# Patient Record
Sex: Female | Born: 1990 | Race: Black or African American | Hispanic: No | Marital: Single | Smoking: Never smoker
Health system: Southern US, Community
[De-identification: ages and names within clinical notes are randomized; demographics above are authoritative.]

## PROBLEM LIST (undated history)

## (undated) ENCOUNTER — Inpatient Hospital Stay (HOSPITAL_COMMUNITY): Payer: Self-pay

---

## 2011-08-01 ENCOUNTER — Other Ambulatory Visit: Payer: Self-pay | Admitting: Emergency Medicine

## 2011-08-01 ENCOUNTER — Ambulatory Visit (HOSPITAL_COMMUNITY)
Admission: RE | Admit: 2011-08-01 | Discharge: 2011-08-01 | Disposition: A | Discharge status: Home / Self Care | Payer: No Typology Code available for payment source | Source: Ambulatory Visit | Attending: Emergency Medicine | Admitting: Emergency Medicine

## 2011-08-01 DIAGNOSIS — S0990XA Unspecified injury of head, initial encounter: Secondary | ICD-10-CM | POA: Insufficient documentation

## 2011-08-01 DIAGNOSIS — T1490XA Injury, unspecified, initial encounter: Secondary | ICD-10-CM
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
## 2018-01-15 ENCOUNTER — Other Ambulatory Visit: Payer: Self-pay

## 2018-01-15 ENCOUNTER — Emergency Department (HOSPITAL_BASED_OUTPATIENT_CLINIC_OR_DEPARTMENT_OTHER)
Admission: EM | Admit: 2018-01-15 | Discharge: 2018-01-15 | Disposition: A | Discharge status: Home / Self Care | Payer: Medicaid Other | Attending: Emergency Medicine | Admitting: Emergency Medicine

## 2018-01-15 ENCOUNTER — Encounter (HOSPITAL_BASED_OUTPATIENT_CLINIC_OR_DEPARTMENT_OTHER): Payer: Self-pay

## 2018-01-15 DIAGNOSIS — R220 Localized swelling, mass and lump, head: Secondary | ICD-10-CM | POA: Insufficient documentation

## 2018-01-15 MED ORDER — PREDNISONE 50 MG PO TABS: ORAL_TABLET | ORAL | 0 refills | Status: DC

## 2018-01-15 MED ORDER — METHYLPREDNISOLONE SODIUM SUCC 125 MG IJ SOLR
125.0000 mg | Freq: Once | INTRAMUSCULAR | Status: AC
  Administered 2018-01-15: 125 mg via INTRAVENOUS
  Filled 2018-01-15: qty 2

## 2018-01-15 MED ORDER — DIPHENHYDRAMINE HCL 50 MG/ML IJ SOLN
25.0000 mg | Freq: Once | INTRAMUSCULAR | Status: AC
  Administered 2018-01-15: 25 mg via INTRAVENOUS
  Filled 2018-01-15: qty 1

## 2018-01-15 MED ORDER — FAMOTIDINE 20 MG PO TABS: 20.0000 mg | ORAL_TABLET | Freq: Every day | ORAL | 0 refills | Status: DC

## 2018-01-15 MED ORDER — VALACYCLOVIR HCL 1 G PO TABS: 1000.0000 mg | ORAL_TABLET | Freq: Three times a day (TID) | ORAL | 0 refills | Status: DC

## 2018-01-15 MED ORDER — FAMOTIDINE IN NACL 20-0.9 MG/50ML-% IV SOLN
20.0000 mg | Freq: Once | INTRAVENOUS | Status: AC
  Administered 2018-01-15: 20 mg via INTRAVENOUS
  Filled 2018-01-15: qty 50

## 2018-01-15 NOTE — Discharge Instructions (Addendum)
Take the steroids (prednisone) as prescribed, starting tomorrow.  Stop using your lip gloss.  Also take the antiviral medication (Valtrex; which you should start today) as there is some concern for possible viral infection.  Establish care with a primary doctor.  Return to the ED if you develop difficulty breathing, difficulty swallowing or any other concerns.

## 2018-01-15 NOTE — ED Provider Notes (Addendum)
.  I assumed care of this patient from Dr. Manus Gunningancour at 0700.  Please see their note for further details of Hx, PE.  Briefly patient is a 27 y.o. female who presented with lip swelling concerning for possible contact reaction to lip gloss vs HSV infection.   Current plan is to monitor for additional hour and reassess.  8:16 AM Pt reports mild improvement in swelling. No respiratory sxs.  The patient is safe for discharge with strict return precautions.   Disposition: Discharge  Condition: Good  I have discussed the results, Dx and Tx plan with the patient who expressed understanding and agree(s) with the plan. Discharge instructions discussed at great length. The patient was given strict return precautions who verbalized understanding of the instructions. No further questions at time of discharge.    ED Discharge Orders        Ordered    predniSONE (DELTASONE) 50 MG tablet     01/15/18 0659    valACYclovir (VALTREX) 1000 MG tablet  3 times daily     01/15/18 0659    famotidine (PEPCID) 20 MG tablet  Daily     01/15/18 0708       [facility] [address] [place] [id number] [phone] Schedule an appointment as soon as possible for a visit in 2 days      Cardama, Amadeo GarnetPedro Eduardo, MD 01/15/18 [phone]

## 2018-01-15 NOTE — ED Triage Notes (Signed)
Pt reports upper lip swelling since yesterday. Pt denies sob, dysphagia and dyspnea. Pt denies hx of oral swelling. Pt A+OX4.

## 2018-01-15 NOTE — ED Notes (Addendum)
Pt reports starting a new lip gloss x2 weeks ago, but has had no diet or soap change.

## 2018-01-15 NOTE — ED Provider Notes (Signed)
[facility] EMERGENCY DEPARTMENT Provider Note   CSN: [id number] Arrival date & time: 01/15/18  0546     History   Chief Complaint Chief Complaint  Patient presents with  . Oral Swelling    HPI Valerie Fleming is a 27 y.o. female.  Patient presents with left upper lip swelling that onset last night is progressively worsens.  Patient states she took some Benadryl last night without relief.  States her lip was itching yesterday and she scratched it but denies any other trauma.  Denies any new exposures to foods, medications, makeup, eating products or other hygiene products.  No difficulty breathing or difficulty swallowing.  No chest pain or shortness of breath.  This is not happened to her before.  She denies any bites or other exposures. No history of cold sores. No ACE inhibitor use. Has been using a new lip gloss for 3 weeks.   The history is provided by the patient.    History reviewed. No pertinent past medical history.  There are no active problems to display for this patient.   * The histories are not reviewed yet. Please review them in the "History" navigator section and refresh this SmartLink.   OB History   None      Home Medications    Prior to Admission medications   Not on File    Family History History reviewed. No pertinent family history.  Social History Social History   Tobacco Use  . Smoking status: Not on file  Substance Use Topics  . Alcohol use: Not on file  . Drug use: Not on file     Allergies   Patient has no known allergies.   Review of Systems Review of Systems  Constitutional: Negative for activity change, appetite change and fever.  HENT: Positive for facial swelling and mouth sores. Negative for congestion and trouble swallowing.   Respiratory: Negative for cough, chest tightness and shortness of breath.   Cardiovascular: Negative for chest pain and leg swelling.  Gastrointestinal: Negative for abdominal  pain, nausea and vomiting.  Genitourinary: Negative for dysuria, hematuria, vaginal bleeding and vaginal discharge.  Musculoskeletal: Negative for arthralgias and myalgias.  Skin: Negative for rash.  Neurological: Negative for dizziness, weakness and headaches.   all other systems are negative except as noted in the HPI and PMH.     Physical Exam Updated Vital Signs BP 121/73 (BP Location: Right Arm)   Pulse (!) 58   Temp 98.3 F (36.8 C) (Oral)   Resp 18   Ht 5\' 3"  (1.6 m)   Wt 63.5 kg (140 lb)   LMP 12/20/2017   SpO2 100%   BMI 24.80 kg/m   Physical Exam  Constitutional: She is oriented to person, place, and time. She appears well-developed and well-nourished. No distress.  HENT:  Head: Normocephalic and atraumatic.  Mouth/Throat: Oropharynx is clear and moist. No oropharyngeal exudate.  Isolated swelling to upper lip left greater than right. There is some crusting lesions on upper lip as well possibly vesicular  Lower lip is normal. No tongue swelling.  No uvula swelling, posterior pharynx is normal.  Normal voice no stridor.  Eyes: Pupils are equal, round, and reactive to light. Conjunctivae and EOM are normal.  Neck: Normal range of motion. Neck supple.  No meningismus.  Cardiovascular: Normal rate, regular rhythm, normal heart sounds and intact distal pulses.  No murmur heard. Pulmonary/Chest: Effort normal and breath sounds normal. No respiratory distress.  Abdominal: Soft. There is no tenderness.  There is no rebound and no guarding.  Musculoskeletal: Normal range of motion. She exhibits no edema or tenderness.  Neurological: She is alert and oriented to person, place, and time. No cranial nerve deficit. She exhibits normal muscle tone. Coordination normal.  No ataxia on finger to nose bilaterally. No pronator drift. 5/5 strength throughout. CN 2-12 intact.Equal grip strength. Sensation intact.   Skin: Skin is warm.  Psychiatric: She has a normal mood and affect.  Her behavior is normal.  Nursing note and vitals reviewed.      ED Treatments / Results  Labs (all labs ordered are listed, but only abnormal results are displayed) Labs Reviewed - No data to display  EKG None  Radiology No results found.  Procedures Procedures (including critical care time)  Medications Ordered in ED Medications  famotidine (PEPCID) IVPB 20 mg premix (20 mg Intravenous New Bag/Given 01/15/18 [id number])  methylPREDNISolone sodium succinate (SOLU-MEDROL) 125 mg/2 mL injection 125 mg (125 mg Intravenous Given 01/15/18 0619)  diphenhydrAMINE (BENADRYL) injection 25 mg (25 mg Intravenous Given 01/15/18 [id number])     Initial Impression / Assessment and Plan / ED Course  I have reviewed the triage vital signs and the nursing notes.  Pertinent labs & imaging results that were available during my care of the patient were reviewed by me and considered in my medical decision making (see chart for details).    Upper lip swelling since last night.  There is no tongue swelling or posterior pharynx swelling.  Patient denies any new allergens.  She does not take medications.  Doubt angioedema. Suspect localized reaction likely due to lip gloss?  Patient noticing some improvement after steroids and antihistamines.  Advised to discontinue lip gloss. Given her crusty vesicular lesions on her lip will also treat with Valtrex. Plan observation till 8 AM and then discharge if no further progression of her lip swelling. Dr. Eudelia Bunchardama to assume care at shift change.  Final Clinical Impressions(s) / ED Diagnoses   Final diagnoses:  Swelling of upper lip    ED Discharge Orders    None       Adamariz Gillott, Jeannett SeniorStephen, MD 01/15/18 [phone]

## 2018-06-23 ENCOUNTER — Encounter (HOSPITAL_BASED_OUTPATIENT_CLINIC_OR_DEPARTMENT_OTHER): Payer: Self-pay

## 2018-06-23 ENCOUNTER — Emergency Department (HOSPITAL_BASED_OUTPATIENT_CLINIC_OR_DEPARTMENT_OTHER)
Admission: EM | Admit: 2018-06-23 | Discharge: 2018-06-23 | Disposition: A | Discharge status: Home / Self Care | Payer: Medicaid Other | Attending: Emergency Medicine | Admitting: Emergency Medicine

## 2018-06-23 ENCOUNTER — Other Ambulatory Visit: Payer: Self-pay

## 2018-06-23 DIAGNOSIS — H1032 Unspecified acute conjunctivitis, left eye: Secondary | ICD-10-CM | POA: Insufficient documentation

## 2018-06-23 MED ORDER — FLUORESCEIN SODIUM 1 MG OP STRP
ORAL_STRIP | OPHTHALMIC | Status: AC
  Filled 2018-06-23: qty 1

## 2018-06-23 MED ORDER — FLUORESCEIN SODIUM 1 MG OP STRP
1.0000 | ORAL_STRIP | Freq: Once | OPHTHALMIC | Status: AC
  Administered 2018-06-23: 1 via OPHTHALMIC

## 2018-06-23 MED ORDER — TETRACAINE HCL 0.5 % OP SOLN
1.0000 [drp] | Freq: Once | OPHTHALMIC | Status: AC
  Administered 2018-06-23: 2 [drp] via OPHTHALMIC

## 2018-06-23 MED ORDER — TETRACAINE HCL 0.5 % OP SOLN
OPHTHALMIC | Status: AC
  Filled 2018-06-23: qty 4

## 2018-06-23 MED ORDER — CIPROFLOXACIN HCL 0.3 % OP SOLN: 2.0000 [drp] | OPHTHALMIC | 0 refills | Status: DC

## 2018-06-23 NOTE — ED Provider Notes (Signed)
[facility] EMERGENCY DEPARTMENT Provider Note   CSN: [id number] Arrival date & time: 06/23/18  0014     History   Chief Complaint Chief Complaint  Patient presents with  . Facial Swelling    HPI Breleigh Carpino is a 27 y.o. female.  The history is provided by the patient.  Eye Problem   This is a new problem. The current episode started 2 days ago. The problem occurs constantly. The problem has not changed since onset.There is a problem in the left eye. There was no injury mechanism. The patient is experiencing no pain. There is no history of trauma to the eye. There is no known exposure to pink eye. She does not wear contacts. Associated symptoms include eye redness. Pertinent negatives include no blurred vision, no decreased vision and no photophobia. She has tried nothing for the symptoms. The treatment provided no relief.  Swelling of the eye and eyelid.    History reviewed. No pertinent past medical history.  There are no active problems to display for this patient.   History reviewed. No pertinent surgical history.   OB History   None      Home Medications    Prior to Admission medications   Medication Sig Start Date End Date Taking? Authorizing Provider  famotidine (PEPCID) 20 MG tablet Take 1 tablet (20 mg total) by mouth daily. 01/15/18   Rancour, Jeannett Senior, MD  valACYclovir (VALTREX) 1000 MG tablet Take 1 tablet (1,000 mg total) by mouth 3 (three) times daily. 01/15/18   Glynn Octave, MD    Family History No family history on file.  Social History Social History   Tobacco Use  . Smoking status: Not on file  Substance Use Topics  . Alcohol use: Not on file  . Drug use: Not on file     Allergies   Patient has no known allergies.   Review of Systems Review of Systems  HENT: Negative for drooling and hearing loss.   Eyes: Positive for redness and itching. Negative for blurred vision, photophobia and visual disturbance.  Respiratory:  Negative for shortness of breath.   Cardiovascular: Negative for chest pain.  All other systems reviewed and are negative.    Physical Exam Updated Vital Signs BP 122/66 (BP Location: Left Arm)   Pulse (!) 58   Temp 98.6 F (37 C) (Oral)   Resp 18   Ht 5\' 3"  (1.6 m)   Wt 59 kg   LMP 05/27/2018   SpO2 100%   BMI 23.03 kg/m   Physical Exam  Constitutional: She is oriented to person, place, and time. She appears well-developed and well-nourished. No distress.  HENT:  Head: Normocephalic and atraumatic.  Mouth/Throat: No oropharyngeal exudate.  Eyes: Pupils are equal, round, and reactive to light. EOM are normal. Left eye exhibits discharge. Left conjunctiva is injected. Left eye exhibits normal extraocular motion and no nystagmus.    Neck: Normal range of motion. Neck supple.  Cardiovascular: Normal rate, regular rhythm, normal heart sounds and intact distal pulses.  Pulmonary/Chest: Effort normal and breath sounds normal. No respiratory distress.  Abdominal: Soft. Bowel sounds are normal. She exhibits no mass. There is no tenderness. There is no guarding.  Musculoskeletal: Normal range of motion.  Neurological: She is alert and oriented to person, place, and time.  Skin: Skin is warm and dry. Capillary refill takes less than 2 seconds.  Psychiatric: She has a normal mood and affect.     ED Treatments / Results  Labs (  all labs ordered are listed, but only abnormal results are displayed) Labs Reviewed - No data to display  EKG None  Radiology No results found.  Procedures Procedures (including critical care time)  Medications Ordered in ED Medications  fluorescein 1 MG ophthalmic strip (has no administration in time range)  tetracaine (PONTOCAINE) 0.5 % ophthalmic solution (has no administration in time range)  fluorescein ophthalmic strip 1 strip (1 strip Left Eye Given 06/23/18 0028)  tetracaine (PONTOCAINE) 0.5 % ophthalmic solution 1-2 drop (2 drops Left Eye  Given 06/23/18 0028)       Final Clinical Impressions(s) / ED Diagnoses     Will start eye drops.  Lid swelling likely allergic start allergy medication.    Return for weakness, numbness, changes in vision or speech, fevers >100.4 unrelieved by medication, shortness of breath, intractable vomiting, or diarrhea, abdominal pain, Inability to tolerate liquids or food, cough, altered mental status or any concerns. No signs of systemic illness or infection. The patient is nontoxic-appearing on exam and vital signs are within normal limits.    I have reviewed the triage vital signs and the nursing notes. Pertinent labs &imaging results that were available during my care of the patient were reviewed by me and considered in my medical decision making (see chart for details).  After history, exam, and medical workup I feel the patient has been appropriately medically screened and is safe for discharge home. Pertinent diagnoses were discussed with the patient. Patient was given return precautions.      Doloris Servantes, MD 06/23/18 [phone]

## 2018-06-23 NOTE — ED Triage Notes (Signed)
Pt c/o swelling and itching to left eye and left cheek that started this afternoon

## 2018-08-17 DIAGNOSIS — Z1388 Encounter for screening for disorder due to exposure to contaminants: Secondary | ICD-10-CM | POA: Diagnosis not present

## 2018-08-17 DIAGNOSIS — Z3009 Encounter for other general counseling and advice on contraception: Secondary | ICD-10-CM | POA: Diagnosis not present

## 2018-08-17 DIAGNOSIS — Z0389 Encounter for observation for other suspected diseases and conditions ruled out: Secondary | ICD-10-CM | POA: Diagnosis not present

## 2018-11-07 ENCOUNTER — Ambulatory Visit: Payer: Medicaid Other | Admitting: Obstetrics

## 2018-11-07 ENCOUNTER — Encounter: Payer: Self-pay | Admitting: Obstetrics

## 2018-11-07 VITALS — BP 122/76 | HR 62 | Wt 146.6 lb

## 2018-11-07 MED ORDER — FLUCONAZOLE 200 MG PO TABS: 200.0000 mg | ORAL_TABLET | ORAL | 2 refills | Status: DC

## 2018-11-07 NOTE — Progress Notes (Signed)
Pt presents for vaginal irritation. Pt needs pap smear; however, she started her period today and it's heavy.

## 2018-11-07 NOTE — Progress Notes (Signed)
Patient rescheduled because she started period today.  Brock BadHARLES A. Mitchelle Sultan MD 11-07-2018

## 2018-11-18 ENCOUNTER — Ambulatory Visit (INDEPENDENT_AMBULATORY_CARE_PROVIDER_SITE_OTHER): Payer: Medicaid Other | Admitting: Obstetrics

## 2018-11-18 ENCOUNTER — Encounter: Payer: Self-pay | Admitting: Obstetrics

## 2018-11-18 ENCOUNTER — Other Ambulatory Visit (HOSPITAL_COMMUNITY)
Admission: RE | Admit: 2018-11-18 | Discharge: 2018-11-18 | Disposition: A | Discharge status: Home / Self Care | Payer: Medicaid Other | Source: Ambulatory Visit | Attending: Obstetrics | Admitting: Obstetrics

## 2018-11-18 VITALS — BP 111/72 | HR 58 | Resp 16 | Ht 63.0 in | Wt 139.4 lb

## 2018-11-18 DIAGNOSIS — Z01419 Encounter for gynecological examination (general) (routine) without abnormal findings: Secondary | ICD-10-CM | POA: Insufficient documentation

## 2018-11-18 DIAGNOSIS — Z30015 Encounter for initial prescription of vaginal ring hormonal contraceptive: Secondary | ICD-10-CM

## 2018-11-18 DIAGNOSIS — N898 Other specified noninflammatory disorders of vagina: Secondary | ICD-10-CM

## 2018-11-18 DIAGNOSIS — Z113 Encounter for screening for infections with a predominantly sexual mode of transmission: Secondary | ICD-10-CM | POA: Diagnosis not present

## 2018-11-18 DIAGNOSIS — Z0000 Encounter for general adult medical examination without abnormal findings: Secondary | ICD-10-CM

## 2018-11-18 DIAGNOSIS — B3731 Acute candidiasis of vulva and vagina: Secondary | ICD-10-CM

## 2018-11-18 DIAGNOSIS — Z3009 Encounter for other general counseling and advice on contraception: Secondary | ICD-10-CM

## 2018-11-18 DIAGNOSIS — B9689 Other specified bacterial agents as the cause of diseases classified elsewhere: Secondary | ICD-10-CM

## 2018-11-18 DIAGNOSIS — N760 Acute vaginitis: Secondary | ICD-10-CM

## 2018-11-18 DIAGNOSIS — B373 Candidiasis of vulva and vagina: Secondary | ICD-10-CM

## 2018-11-18 MED ORDER — FLUCONAZOLE 200 MG PO TABS: 200.0000 mg | ORAL_TABLET | ORAL | 5 refills | Status: DC

## 2018-11-18 MED ORDER — ETONOGESTREL-ETHINYL ESTRADIOL 0.12-0.015 MG/24HR VA RING: VAGINAL_RING | VAGINAL | 12 refills | Status: DC

## 2018-11-18 MED ORDER — METRONIDAZOLE 500 MG PO TABS: 500.0000 mg | ORAL_TABLET | Freq: Two times a day (BID) | ORAL | 5 refills | Status: DC

## 2018-11-18 NOTE — Patient Instructions (Signed)
Antibiotic Medicine, Adult ° °Antibiotic medicines are used to treat infections caused by bacteria, such as strep throat and urinary tract infection (UTI). Antibiotic medicines will not work for viral illnesses, such as colds or the flu (influenza). They work by killing the bacteria that is making you sick. Antibiotics can also have serious side effects. It is important that you take antibiotic medicines safely and only when needed. °When do I need to take antibiotics? °Antibiotics are medicines that treat bacterial infections. You may need antibiotics for: °· UTI. °· Strep throat. °· Meningitis. This infection affects the spinal cord and brain. °· Bacterial sinusitis. °· Serious lung infection. °You may start antibiotics while your health care provider waits for test results to come back. Common tests may include throat, urine, blood, or mucus culture. Your health care provider may change or stop the antibiotic depending on your test results. °When are antibiotics not needed? °You do not need antibiotics for most common illnesses. These illnesses may be caused by a virus, not a bacteria. You do not need antibiotics for: °· The common cold. °· Influenza. °· Sore throat. °· Discolored mucus. °· Bronchitis. °Antibiotics are not always needed for all bacterial infections. Many of these infections clear up without antibiotic treatment. Do not ask for or take antibiotics when they are not necessary. °How long should I take the antibiotic? °You must take the entire prescription. Continue to take your antibiotic for as long as told by your health care provider. Do not stop taking it even if you start to feel better. If you stop taking it too soon: °· You may start to feel sick again. °· Your infection may become harder to treat. °· Complications may develop. °Each course of antibiotics needs a different amount of time to work. Some antibiotic courses last only a few days. Some last about a week to 10 days. In some cases,  you may need to take antibiotics for a few weeks to completely treat the infection. °What if I miss a dose? °Try not to miss any doses of medicine. If you miss a dose, call your health care provider or pharmacist for advice. Sometimes it is okay to take the missed dose as soon as possible. °What are the risks of taking antibiotics? °Most antibiotics can cause an infection called Clostridioides difficile (C. difficile or C. diff), which causes severe diarrhea. This infection happens when the antibiotics kill the healthy bacteria in your intestines. This allows C. diff to grow. The infection needs to be treated right away. Let your health care provider know if: °· You have diarrhea while taking an antibiotic. °· You have diarrhea after you stop taking an antibiotic. C. diff infection can start weeks after stopping the antibiotic. °Taking an antibiotic also puts you at risk for getting a bacteria that does not respond to medicine (antibiotic-resistant infection) in the future. Antibiotics can cause bacteria to change so that if the antibiotic is taken again, the medicine is not able to kill the bacteria. These infections can be more serious and, in some cases, life-threatening. °Do antibiotics affect birth control? °Birth control pills may not work while you are on antibiotics. If you are taking birth control pills, continue taking them as usual and use a second form of birth control, such as a condom, to avoid unwanted pregnancy. Continue using the second form of birth control until your health care provider says you can stop. °What else should I know about taking antibiotics? °It is important for you to   take antibiotics exactly as told. Make sure that you: °· Take the entire course of antibiotic that was prescribed. Do not stop taking your antibiotics even if your symptoms improve. °· Take the correct amount of medicine each day. °· Ask your health care provider: °? How long to wait in between doses. °? If the  antibiotic should be taken with food. °? If there are any foods, drinks, or medicines that you should avoid while taking the antibiotics. °? If there are any side effects you should be aware of. °· Only use the antibiotics prescribed for you by your health care provider. Do not use antibiotics prescribed for someone else. °· Drink a large glass of water along with the antibiotics. °· Ask the pharmacist for a syringe, cup, or spoon that properly measures the antibiotics. °· Throw away any leftover medicine. °Contact a health care provider if: °· Your symptoms get worse. °· You have new joint pain or muscle aches that begin after starting the antibiotic. °When should I seek immediate medical care? °· You have signs of a serious allergic reaction to antibiotics. If you have signs of a severe allergic reaction, stop taking the antibiotic right away. Signs may include: °? Hives, which are raised, itchy, red bumps on the skin. °? Skin rash. °? Trouble breathing. °? A wheezing sound when you breathe. °? Swelling anywhere on your body. °? Feeling dizzy. °? Vomiting. °· Your urine turns dark or becomes blood-colored. °· Your skin turns yellow. °· You bruise or bleed easily. °· You have severe diarrhea and abdominal cramps. °· You have a severe headache. °Summary °· Antibiotic medicines are used to treat infections caused by bacteria, such as strep throat and UTIs. It is important that you take antibiotic medicines only when needed. °· Your health care provider may change or stop the antibiotic depending on your test results. °· Most antibiotics can cause an infection called Clostridioides difficile (C. difficile or C. diff), which causes severe diarrhea. Let your health care provider know if you develop diarrhea while taking an antibiotic. °· Take the entire course of antibiotic that was prescribed. °This information is not intended to replace advice given to you by your health care provider. Make sure you discuss any  questions you have with your health care provider. °Document Released: 05/27/2004 Document Revised: 03/15/2018 Document Reviewed: 09/15/2016 °[employer] Interactive Patient Education © 2019 [employer] ° °
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
## 2018-11-18 NOTE — Progress Notes (Signed)
Patient is here c/o for annual; med refill on nuvaring; vaginal discharge - little odor, watery for the ast two weeks. Seen at [facility] about a month ago Dx BV/CV Rx Flagyl and Diflucan.

## 2018-11-19 LAB — RPR: RPR Ser Ql: NONREACTIVE

## 2018-11-19 LAB — HEPATITIS B SURFACE ANTIGEN: Hepatitis B Surface Ag: NEGATIVE

## 2018-11-19 LAB — HIV ANTIBODY (ROUTINE TESTING W REFLEX): HIV SCREEN 4TH GENERATION: NONREACTIVE

## 2018-11-19 LAB — HEPATITIS C ANTIBODY

## 2018-11-21 LAB — CERVICOVAGINAL ANCILLARY ONLY
BACTERIAL VAGINITIS: NEGATIVE
Candida vaginitis: NEGATIVE
Chlamydia: POSITIVE — AB
NEISSERIA GONORRHEA: NEGATIVE
TRICH (WINDOWPATH): NEGATIVE

## 2018-11-22 ENCOUNTER — Other Ambulatory Visit: Payer: Self-pay | Admitting: Obstetrics

## 2018-11-22 DIAGNOSIS — A749 Chlamydial infection, unspecified: Secondary | ICD-10-CM

## 2018-11-22 LAB — CYTOLOGY - PAP: Diagnosis: NEGATIVE

## 2018-11-22 MED ORDER — AZITHROMYCIN 500 MG PO TABS: 1000.0000 mg | ORAL_TABLET | Freq: Once | ORAL | 0 refills | Status: AC

## 2018-11-22 MED ORDER — CEFIXIME 400 MG PO CAPS: 400.0000 mg | ORAL_CAPSULE | Freq: Once | ORAL | 0 refills | Status: AC

## 2020-07-31 DIAGNOSIS — Z113 Encounter for screening for infections with a predominantly sexual mode of transmission: Secondary | ICD-10-CM | POA: Diagnosis not present

## 2020-12-10 DIAGNOSIS — Z113 Encounter for screening for infections with a predominantly sexual mode of transmission: Secondary | ICD-10-CM | POA: Diagnosis not present

## 2022-02-18 DIAGNOSIS — Z114 Encounter for screening for human immunodeficiency virus [HIV]: Secondary | ICD-10-CM | POA: Diagnosis not present

## 2022-02-18 DIAGNOSIS — Z3044 Encounter for surveillance of vaginal ring hormonal contraceptive device: Secondary | ICD-10-CM | POA: Diagnosis not present

## 2022-02-18 DIAGNOSIS — Z113 Encounter for screening for infections with a predominantly sexual mode of transmission: Secondary | ICD-10-CM | POA: Diagnosis not present

## 2022-02-18 DIAGNOSIS — B3731 Acute candidiasis of vulva and vagina: Secondary | ICD-10-CM | POA: Diagnosis not present

## 2022-09-10 DIAGNOSIS — Z3202 Encounter for pregnancy test, result negative: Secondary | ICD-10-CM | POA: Diagnosis not present

## 2022-09-10 DIAGNOSIS — Z114 Encounter for screening for human immunodeficiency virus [HIV]: Secondary | ICD-10-CM | POA: Diagnosis not present

## 2022-09-10 DIAGNOSIS — Z113 Encounter for screening for infections with a predominantly sexual mode of transmission: Secondary | ICD-10-CM | POA: Diagnosis not present

## 2022-09-10 DIAGNOSIS — N898 Other specified noninflammatory disorders of vagina: Secondary | ICD-10-CM | POA: Diagnosis not present

## 2023-01-15 ENCOUNTER — Other Ambulatory Visit: Payer: Self-pay

## 2023-01-15 ENCOUNTER — Emergency Department (HOSPITAL_BASED_OUTPATIENT_CLINIC_OR_DEPARTMENT_OTHER)
Admission: EM | Admit: 2023-01-15 | Discharge: 2023-01-15 | Disposition: A | Discharge status: Home / Self Care | Payer: No Typology Code available for payment source | Attending: Emergency Medicine | Admitting: Emergency Medicine

## 2023-01-15 ENCOUNTER — Encounter (HOSPITAL_BASED_OUTPATIENT_CLINIC_OR_DEPARTMENT_OTHER): Payer: Self-pay

## 2023-01-15 DIAGNOSIS — R519 Headache, unspecified: Secondary | ICD-10-CM | POA: Diagnosis not present

## 2023-01-15 DIAGNOSIS — Y92410 Unspecified street and highway as the place of occurrence of the external cause: Secondary | ICD-10-CM | POA: Diagnosis not present

## 2023-01-15 MED ORDER — IBUPROFEN 600 MG PO TABS: 600.0000 mg | ORAL_TABLET | Freq: Four times a day (QID) | ORAL | 0 refills | Status: DC | PRN

## 2023-01-15 MED ORDER — CYCLOBENZAPRINE HCL 10 MG PO TABS: 10.0000 mg | ORAL_TABLET | Freq: Two times a day (BID) | ORAL | 0 refills | Status: AC | PRN

## 2023-01-15 MED ORDER — ACETAMINOPHEN 325 MG PO TABS: 650.0000 mg | ORAL_TABLET | Freq: Four times a day (QID) | ORAL | 0 refills | Status: AC | PRN
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
## 2023-01-15 NOTE — Discharge Instructions (Addendum)
Your headache may be related to a muscle strain in the back of your scalp.  I recommended ibuprofen and Tylenol as needed for pain, and muscle relaxers may help you at night with your shoulder pain and your headache.  We discussed the option of a CT scan to make sure that there is no bleeding inside the skull or more serious injury.  We discussed the risks and benefits of obtaining a CT scan now, versus a watchful waiting approach at home.  You did not want the CT scan at this time.  I advised that you should return to the ER if you have worsening headache, blurred vision, strokelike symptoms, or any other emergency medical concerns.
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
## 2023-02-11 DIAGNOSIS — Z113 Encounter for screening for infections with a predominantly sexual mode of transmission: Secondary | ICD-10-CM | POA: Diagnosis not present

## 2023-02-11 DIAGNOSIS — B9689 Other specified bacterial agents as the cause of diseases classified elsewhere: Secondary | ICD-10-CM | POA: Diagnosis not present

## 2023-02-11 DIAGNOSIS — Z114 Encounter for screening for human immunodeficiency virus [HIV]: Secondary | ICD-10-CM | POA: Diagnosis not present

## 2023-02-11 DIAGNOSIS — N760 Acute vaginitis: Secondary | ICD-10-CM | POA: Diagnosis not present

## 2023-02-11 DIAGNOSIS — B3731 Acute candidiasis of vulva and vagina: Secondary | ICD-10-CM | POA: Diagnosis not present

## 2023-05-25 DIAGNOSIS — Z3202 Encounter for pregnancy test, result negative: Secondary | ICD-10-CM | POA: Diagnosis not present

## 2023-05-25 DIAGNOSIS — Z113 Encounter for screening for infections with a predominantly sexual mode of transmission: Secondary | ICD-10-CM | POA: Diagnosis not present

## 2023-05-25 DIAGNOSIS — Z114 Encounter for screening for human immunodeficiency virus [HIV]: Secondary | ICD-10-CM | POA: Diagnosis not present

## 2023-05-25 DIAGNOSIS — Z3044 Encounter for surveillance of vaginal ring hormonal contraceptive device: Secondary | ICD-10-CM | POA: Diagnosis not present

## 2023-08-27 DIAGNOSIS — Z114 Encounter for screening for human immunodeficiency virus [HIV]: Secondary | ICD-10-CM | POA: Diagnosis not present

## 2023-08-27 DIAGNOSIS — Z113 Encounter for screening for infections with a predominantly sexual mode of transmission: Secondary | ICD-10-CM | POA: Diagnosis not present

## 2024-01-10 DIAGNOSIS — N898 Other specified noninflammatory disorders of vagina: Secondary | ICD-10-CM | POA: Diagnosis not present

## 2024-01-10 DIAGNOSIS — Z113 Encounter for screening for infections with a predominantly sexual mode of transmission: Secondary | ICD-10-CM | POA: Diagnosis not present

## 2024-01-10 DIAGNOSIS — Z3202 Encounter for pregnancy test, result negative: Secondary | ICD-10-CM | POA: Diagnosis not present

## 2024-02-18 ENCOUNTER — Encounter (HOSPITAL_BASED_OUTPATIENT_CLINIC_OR_DEPARTMENT_OTHER): Payer: Self-pay

## 2024-02-18 ENCOUNTER — Emergency Department (HOSPITAL_BASED_OUTPATIENT_CLINIC_OR_DEPARTMENT_OTHER)
Admission: EM | Admit: 2024-02-18 | Discharge: 2024-02-18 | Disposition: A | Discharge status: Home / Self Care | Attending: Emergency Medicine | Admitting: Emergency Medicine

## 2024-02-18 ENCOUNTER — Emergency Department (HOSPITAL_BASED_OUTPATIENT_CLINIC_OR_DEPARTMENT_OTHER)

## 2024-02-18 ENCOUNTER — Other Ambulatory Visit: Payer: Self-pay

## 2024-02-18 DIAGNOSIS — O26891 Other specified pregnancy related conditions, first trimester: Secondary | ICD-10-CM

## 2024-02-18 DIAGNOSIS — Z3A00 Weeks of gestation of pregnancy not specified: Secondary | ICD-10-CM | POA: Diagnosis not present

## 2024-02-18 DIAGNOSIS — Z3A01 Less than 8 weeks gestation of pregnancy: Secondary | ICD-10-CM | POA: Diagnosis not present

## 2024-02-18 DIAGNOSIS — R1030 Lower abdominal pain, unspecified: Secondary | ICD-10-CM | POA: Diagnosis not present

## 2024-02-18 DIAGNOSIS — O26899 Other specified pregnancy related conditions, unspecified trimester: Secondary | ICD-10-CM | POA: Diagnosis present

## 2024-02-18 LAB — URINALYSIS, ROUTINE W REFLEX MICROSCOPIC
Bilirubin Urine: NEGATIVE
Glucose, UA: NEGATIVE mg/dL
Hgb urine dipstick: NEGATIVE
Ketones, ur: NEGATIVE mg/dL
Nitrite: NEGATIVE
Protein, ur: NEGATIVE mg/dL
Specific Gravity, Urine: 1.02 (ref 1.005–1.030)
pH: 5.5 (ref 5.0–8.0)

## 2024-02-18 LAB — URINALYSIS, MICROSCOPIC (REFLEX)

## 2024-02-18 LAB — PREGNANCY, URINE: Preg Test, Ur: POSITIVE — AB

## 2024-02-18 LAB — HCG, QUANTITATIVE, PREGNANCY: hCG, Beta Chain, Quant, S: 49 m[IU]/mL — ABNORMAL HIGH (ref <5)

## 2024-02-18 NOTE — ED Provider Notes (Signed)
 [place] [facility] Provider Note   CSN: [id number] Arrival date & time: 02/18/24  1106     History  Chief Complaint  Patient presents with   Abdominal Cramping    Valerie Fleming is a 33 y.o. female.  33 year old female presenting with abdominal cramping.  Patient took 2 pregnancy tests last night which were both positive, she was concerned because she was experiencing 9/10 lower abdominal cramping.  Her LMP was sometime in April, she is unsure of the specific date.  She denies nausea/vomiting/diarrhea, chest pain, shortness of breath, lower extremity swelling, dysuria/hematuria.  She plans to follow-up with an OB/GYN given recent positive pregnancy test.   Abdominal Cramping       Home Medications Prior to Admission medications   Medication Sig Start Date End Date Taking? Authorizing Provider  acetaminophen  (TYLENOL ) 325 MG tablet Take 2 tablets (650 mg total) by mouth every 6 (six) hours as needed for up to 30 doses for moderate pain. 01/15/23   Arvilla Birmingham, MD  cyclobenzaprine  (FLEXERIL ) 10 MG tablet Take 1 tablet (10 mg total) by mouth 2 (two) times daily as needed for up to 10 doses for muscle spasms. 01/15/23   Arvilla Birmingham, MD  etonogestrel -ethinyl estradiol  (NUVARING) 0.12-0.015 MG/24HR vaginal ring Place 1 applicator vaginally every 30 (thirty) days.    [provider]  etonogestrel -ethinyl estradiol  (NUVARING) 0.12-0.015 MG/24HR vaginal ring Insert vaginally and leave in place for 3 consecutive weeks, then remove for 1 week. 11/18/18   Gabrielle Joiner, MD  fluconazole  (DIFLUCAN ) 200 MG tablet Take 1 tablet (200 mg total) by mouth every 3 (three) days. 11/18/18   Gabrielle Joiner, MD  ibuprofen  (ADVIL ) 600 MG tablet Take 1 tablet (600 mg total) by mouth every 6 (six) hours as needed for up to 30 doses. 01/15/23   Arvilla Birmingham, MD  metroNIDAZOLE  (FLAGYL ) 500 MG tablet Take 1 tablet (500 mg total) by mouth 2 (two)  times daily. 11/18/18   Gabrielle Joiner, MD      Allergies    Patient has no known allergies.    Review of Systems   Review of Systems  Physical Exam Updated Vital Signs BP 119/64 (BP Location: Right Arm)   Pulse (!) 56   Temp 98.7 F (37.1 C) (Oral)   Resp 18   SpO2 100%  Physical Exam Vitals and nursing note reviewed.  HENT:     Head: Normocephalic.  Eyes:     Extraocular Movements: Extraocular movements intact.  Cardiovascular:     Rate and Rhythm: Normal rate and regular rhythm.     Heart sounds: Normal heart sounds.  Pulmonary:     Effort: Pulmonary effort is normal.     Breath sounds: Normal breath sounds.  Abdominal:     Palpations: Abdomen is soft.     Tenderness: There is abdominal tenderness (generalized lower abdominal tenderness, mild). There is no guarding or rebound.  Neurological:     Mental Status: She is alert.     ED Results / Procedures / Treatments   Labs (all labs ordered are listed, but only abnormal results are displayed) Labs Reviewed  PREGNANCY, URINE - Abnormal; Notable for the following components:      Result Value   Preg Test, Ur POSITIVE (*)    All other components within normal limits  URINALYSIS, ROUTINE W REFLEX MICROSCOPIC - Abnormal; Notable for the following components:   Leukocytes,Ua SMALL (*)    All other  components within normal limits  URINALYSIS, MICROSCOPIC (REFLEX) - Abnormal; Notable for the following components:   Bacteria, UA RARE (*)    All other components within normal limits  HCG, QUANTITATIVE, PREGNANCY    EKG None  Radiology US  OB Transvaginal Result Date: 02/18/2024 CLINICAL DATA:  Low pelvic pain for 2 days, pregnant EXAM: OBSTETRIC <14 WK US  AND TRANSVAGINAL OB US  TECHNIQUE: Both transabdominal and transvaginal ultrasound examinations were performed for complete evaluation of the gestation as well as the maternal uterus, adnexal regions, and pelvic cul-de-sac. Transvaginal technique was performed to  assess early pregnancy. COMPARISON:  None Available. FINDINGS: Intrauterine gestational sac: None Yolk sac:  Not Visualized. Embryo:  Not Visualized. Cardiac Activity: Not Visualized. Heart Rate: Not visualized. Subchorionic hemorrhage:  None visualized. Maternal uterus/adnexae: Complex echogenic lesion of the right ovary measuring 1.7 x 1.0 cm with some peripheral Doppler flow. IMPRESSION: 1. No candidate intrauterine gestation at clinically estimated gestational age of [redacted] weeks, 5 days. Early pregnancy of unknown location. Recommend ectopic precautions, serial beta hCG, and follow-up ultrasound in 7-14 days to assess for continued development and viability. 2. Complex echogenic lesion of the right ovary measuring 1.7 cm, most likely a corpus luteum in this setting. This is not cystic in nature ultrasound and not characteristic in appearance for an ovarian ectopic pregnancy, which are exceedingly rare. Electronically Signed   By: Fredricka Jenny M.D.   On: 02/18/2024 13:57   US  OB Comp < 14 Wks Result Date: 02/18/2024 CLINICAL DATA:  Low pelvic pain for 2 days, pregnant EXAM: OBSTETRIC <14 WK US  AND TRANSVAGINAL OB US  TECHNIQUE: Both transabdominal and transvaginal ultrasound examinations were performed for complete evaluation of the gestation as well as the maternal uterus, adnexal regions, and pelvic cul-de-sac. Transvaginal technique was performed to assess early pregnancy. COMPARISON:  None Available. FINDINGS: Intrauterine gestational sac: None Yolk sac:  Not Visualized. Embryo:  Not Visualized. Cardiac Activity: Not Visualized. Heart Rate: Not visualized. Subchorionic hemorrhage:  None visualized. Maternal uterus/adnexae: Complex echogenic lesion of the right ovary measuring 1.7 x 1.0 cm with some peripheral Doppler flow. IMPRESSION: 1. No candidate intrauterine gestation at clinically estimated gestational age of [redacted] weeks, 5 days. Early pregnancy of unknown location. Recommend ectopic precautions, serial beta  hCG, and follow-up ultrasound in 7-14 days to assess for continued development and viability. 2. Complex echogenic lesion of the right ovary measuring 1.7 cm, most likely a corpus luteum in this setting. This is not cystic in nature ultrasound and not characteristic in appearance for an ovarian ectopic pregnancy, which are exceedingly rare. Electronically Signed   By: Fredricka Jenny M.D.   On: 02/18/2024 13:57    Procedures Procedures    Medications Ordered in ED Medications - No data to display  ED Course/ Medical Decision Making/ A&P                                 Medical Decision Making This patient presents to the ED for concern of abdominal cramping, this involves an extensive number of treatment options, and is a complaint that carries with it a high risk of complications and morbidity.  The differential diagnosis includes uterine cramping, inevitable/missed/spontaneous abortion, ectopic pregnancy.   Co morbidities that complicate the patient evaluation  Pregnancy   Lab Tests:  I Ordered, and personally interpreted labs.  The pertinent results include: Urine hCG positive.  Urinalysis notable for small leukocytes, rare bacteria noted on microscopy.  This is unlikely to be representative of a true urinary tract infection given that patient is not having dysuria, however in accordance with a cog guidelines I will obtain a urine culture and treat patient if >100,000 CFU's.  Quantitative hCG is 49, this is consistent with a gestational age of less than 1 week or may be trending down, I do not have a baseline quantitative hCG for comparison.   Imaging Studies ordered:  I ordered imaging studies including TVUS  I independently visualized and interpreted imaging which showed  1. No candidate intrauterine gestation at clinically estimated gestational age of [redacted] weeks, 5 days. Early pregnancy of unknown location. Recommend ectopic precautions, serial beta hCG, and follow-up ultrasound in  7-14 days to assess for continued development and viability.  2. Complex echogenic lesion of the right ovary measuring 1.7 cm, most likely a corpus luteum in this setting. This is not cystic in nature ultrasound and not characteristic in appearance for an ovarian ectopic pregnancy, which are exceedingly rare.  I agree with the radiologist interpretation     Problem List / ED Course / Critical interventions / Medication management I have reviewed the patients home medicines and have made adjustments as needed    Test / Admission - Considered:  Physical exam notable for tenderness palpation in the lower abdomen.  Vital signs are reassuring, patient is afebrile, no evidence of hypotension, does have borderline bradycardia however this may be representative of her baseline.  See above for lab interpretations and imaging results.  Patient is likely early on in her pregnancy, or quantitative hCG may be representative of a downward trend, however there is no baseline for comparison.  Patient has not been evaluated by OB/GYN for this pregnancy, will advise that she follow-up with [facility]U.  Discussed ectopic precautions, given that at this stage and early pregnancy we cannot rule out ectopic pregnancy definitively, advised her to follow-up in 48 hours with the MAU at [facility] in order to trend her quantitative hCG as well as plan for repeat transvaginal ultrasound in 7 to 14 days in order to assess for possible pregnancy.  Strict return precautions discussed, patient voiced understanding was agreeable with this plan.    Amount and/or Complexity of Data Reviewed Labs: ordered. Radiology: ordered.           Final Clinical Impression(s) / ED Diagnoses Final diagnoses:  Abdominal pain during pregnancy in first trimester    Rx / DC Orders ED Discharge Orders     None         Adolm Ahumada 02/18/24 1417    Nicklas Barns, MD 02/19/24 [phone]

## 2024-02-18 NOTE — Discharge Instructions (Addendum)
 Go to [facility] hospital on [address] to the Maternity Admissions Unit (MAU) for any pregnancy related concerns, follow-up with them in 48hrs for repeat quantitative hcg with plan for repeated transvaginal ultrasound in 7-14 days.  Return to the emergency department if your symptoms worsen.

## 2024-02-18 NOTE — ED Notes (Signed)
 Pt alert and oriented X 4 at the time of discharge. RR even and unlabored. No acute distress noted. Pt verbalized understanding of discharge instructions as discussed. Pt ambulatory to lobby at time of discharge.

## 2024-02-18 NOTE — ED Triage Notes (Signed)
 Pt reports lower abdominal cramping that started yesterday. Took 2 preg test and was +. Denies vaginal bleeding or other symptoms  LMP: "sometime in April"   G:3, P:1

## 2024-02-19 LAB — URINE CULTURE: Culture: NO GROWTH

## 2024-04-12 DIAGNOSIS — Z113 Encounter for screening for infections with a predominantly sexual mode of transmission: Secondary | ICD-10-CM | POA: Diagnosis not present

## 2024-04-12 DIAGNOSIS — Z114 Encounter for screening for human immunodeficiency virus [HIV]: Secondary | ICD-10-CM | POA: Diagnosis not present

## 2024-04-12 DIAGNOSIS — Z30015 Encounter for initial prescription of vaginal ring hormonal contraceptive: Secondary | ICD-10-CM | POA: Diagnosis not present

## 2024-09-29 ENCOUNTER — Inpatient Hospital Stay (HOSPITAL_COMMUNITY)

## 2024-09-29 ENCOUNTER — Inpatient Hospital Stay (HOSPITAL_COMMUNITY)
Admission: AD | Admit: 2024-09-29 | Discharge: 2024-09-29 | Disposition: A | Discharge status: Home / Self Care | Attending: Obstetrics and Gynecology | Admitting: Obstetrics and Gynecology

## 2024-09-29 ENCOUNTER — Other Ambulatory Visit: Payer: Self-pay

## 2024-09-29 ENCOUNTER — Encounter: Payer: Self-pay | Admitting: Nurse Practitioner

## 2024-09-29 DIAGNOSIS — O3680X0 Pregnancy with inconclusive fetal viability, not applicable or unspecified: Secondary | ICD-10-CM

## 2024-09-29 DIAGNOSIS — R7989 Other specified abnormal findings of blood chemistry: Secondary | ICD-10-CM | POA: Diagnosis not present

## 2024-09-29 DIAGNOSIS — O209 Hemorrhage in early pregnancy, unspecified: Secondary | ICD-10-CM | POA: Diagnosis not present

## 2024-09-29 DIAGNOSIS — Z3A00 Weeks of gestation of pregnancy not specified: Secondary | ICD-10-CM | POA: Diagnosis not present

## 2024-09-29 DIAGNOSIS — O26891 Other specified pregnancy related conditions, first trimester: Secondary | ICD-10-CM | POA: Insufficient documentation

## 2024-09-29 DIAGNOSIS — N939 Abnormal uterine and vaginal bleeding, unspecified: Secondary | ICD-10-CM

## 2024-09-29 DIAGNOSIS — Z640 Problems related to unwanted pregnancy: Secondary | ICD-10-CM | POA: Diagnosis not present

## 2024-09-29 DIAGNOSIS — O200 Threatened abortion: Secondary | ICD-10-CM | POA: Diagnosis not present

## 2024-09-29 DIAGNOSIS — O99011 Anemia complicating pregnancy, first trimester: Secondary | ICD-10-CM | POA: Insufficient documentation

## 2024-09-29 DIAGNOSIS — D509 Iron deficiency anemia, unspecified: Secondary | ICD-10-CM | POA: Diagnosis not present

## 2024-09-29 LAB — URINALYSIS, ROUTINE W REFLEX MICROSCOPIC
Bilirubin Urine: NEGATIVE
Glucose, UA: NEGATIVE mg/dL
Ketones, ur: NEGATIVE mg/dL
Leukocytes,Ua: NEGATIVE
Nitrite: NEGATIVE
Protein, ur: NEGATIVE mg/dL
Specific Gravity, Urine: 1.008 (ref 1.005–1.030)
pH: 6 (ref 5.0–8.0)

## 2024-09-29 LAB — HCG, QUANTITATIVE, PREGNANCY: hCG, Beta Chain, Quant, S: 81 m[IU]/mL — ABNORMAL HIGH

## 2024-09-29 LAB — CBC
HCT: 28.8 % — ABNORMAL LOW (ref 36.0–46.0)
Hemoglobin: 8.3 g/dL — ABNORMAL LOW (ref 12.0–15.0)
MCH: 19.6 pg — ABNORMAL LOW (ref 26.0–34.0)
MCHC: 28.8 g/dL — ABNORMAL LOW (ref 30.0–36.0)
MCV: 68.1 fL — ABNORMAL LOW (ref 80.0–100.0)
Platelets: 438 K/uL — ABNORMAL HIGH (ref 150–400)
RBC: 4.23 MIL/uL (ref 3.87–5.11)
RDW: 19.9 % — ABNORMAL HIGH (ref 11.5–15.5)
WBC: 7.1 K/uL (ref 4.0–10.5)
nRBC: 0 % (ref 0.0–0.2)

## 2024-09-29 LAB — ABO/RH: ABO/RH(D): O POS

## 2024-09-29 LAB — POCT PREGNANCY, URINE: Preg Test, Ur: POSITIVE — AB

## 2024-09-29 LAB — WET PREP, GENITAL
Clue Cells Wet Prep HPF POC: NONE SEEN
Sperm: NONE SEEN
Trich, Wet Prep: NONE SEEN
WBC, Wet Prep HPF POC: <10
Yeast Wet Prep HPF POC: NONE SEEN

## 2024-09-29 MED ORDER — FERRIC MALTOL 30 MG PO CAPS: 1.0000 | ORAL_CAPSULE | Freq: Two times a day (BID) | ORAL | 2 refills | Status: AC

## 2024-09-29 NOTE — Telephone Encounter (Signed)
 My chart message sent regarding Hgb 8.3 c/w anemia and RX sent to pharmacy. Patient also made aware that CBC and iron panel may be repeated on Sunday 10/02/23 with iron panel and stat HCG level. --------------------------------------------  Olam Dalton, MSN, [facility], [facility]

## 2024-09-29 NOTE — MAU Provider Note (Signed)
 CC/ vaginal bleeding   S/HPI [name] is a 34 y.o. G1P0 patient who presents to MAU today with complaint of reporting a positive HPT on 09/19/2024 and had an [facility].  She states that she is not desiring this pregnancy and plans to terminate.  She reports vaginal bleeding started last night and was told that she would need to have her pregnancy evaluated prior to proceeding with the termination.  She is unsure of her LMP at this time.   O BP (!) 120/57 (BP Location: Left Arm)   Pulse (!) 57   Temp 98 F (36.7 C) (Oral)   Resp 16   Ht 5' 3 (1.6 m)   Wt 63.5 kg   LMP 01/23/2024 (Within Months)   SpO2 100%   BMI 24.80 kg/m  Physical Exam Vitals and nursing note reviewed.  Constitutional:      General: She is not in acute distress.    Appearance: Normal appearance. She is not ill-appearing.  HENT:     Head: Normocephalic.     Nose: Nose normal.     Mouth/Throat:     Mouth: Mucous membranes are moist.  Cardiovascular:     Rate and Rhythm: Normal rate.  Pulmonary:     Effort: Pulmonary effort is normal.  Abdominal:     General: There is no distension.     Palpations: Abdomen is soft.     Tenderness: There is no abdominal tenderness.  Musculoskeletal:        General: Normal range of motion.     Cervical back: Normal range of motion.  Skin:    General: Skin is warm.  Neurological:     Mental Status: She is alert and oriented to person, place, and time.  Psychiatric:        Mood and Affect: Mood normal.        Behavior: Behavior normal.    MDM  HIGH  Vaginal bleeding in early pregnacy  CBC:  Hgb 8.3/ HCT 28.8 ( Anemia -Patient asymptomatic therefore RX for Iron supplement sent to pharmacy of choice) Patient also encouraged to f/u with Primary Physician regarding low H/H and Anemia  HCG Quant: 81 ( Plan for repeat HCG in 48 hours) Sunday 10/01/24 ABO: O Positive OB Ultrasound (pregnancy of unknown anatomical location at  this time versus early gestation with beta-hCG level at 81-will plan to trend hCG levels) Vaginal Swabs ( Wet prep negative) - GC pending at discharge UA: Large blood otherwise negative for UTI   Differential diagnosis considered for 1st trimester vaginal bleeding includes but is not limited to: ectopic pregnancy, complete spontaneous abortion, incomplete abortion, missed abortion, threatened abortion, embryonic/fetal demise, cervical insufficiency, cervical or vaginal disorder    Orders Placed This Encounter  Procedures   Wet prep, genital    Standing Status:   Standing    Number of Occurrences:   1   US  OB LESS THAN 14 WEEKS WITH OB TRANSVAGINAL    Standing Status:   Standing    Number of Occurrences:   1    Symptom/Reason for Exam:   Vaginal bleeding in pregnancy, first trimester [[id number]]   Urinalysis, Routine w reflex microscopic -Urine, Clean Catch    Standing Status:   Standing    Number of Occurrences:   1    Specimen Source:   Urine, Clean Catch [76]   CBC    Standing Status:   Standing    Number of Occurrences:  1   hCG, quantitative, pregnancy    Standing Status:   Standing    Number of Occurrences:   1   Diet NPO time specified    Standing Status:   Standing    Number of Occurrences:   1   Pregnancy, urine POC    Standing Status:   Standing    Number of Occurrences:   1   ABO/Rh    Standing Status:   Standing    Number of Occurrences:   1   Discharge patient Discharge disposition: 01-Home or Self Care; Discharge patient date: 09/29/2024    Standing Status:   Standing    Number of Occurrences:   1    Discharge disposition:   01-Home or Self Care [1]    Discharge patient date:   09/29/2024      Results for orders placed or performed during the hospital encounter of 09/29/24 (from the past 24 hours)  Wet prep, genital     Status: None   Collection Time: 09/29/24 11:30 AM   Specimen: Urine, Clean Catch  Result Value Ref Range   Yeast Wet Prep HPF POC NONE SEEN NONE  SEEN   Trich, Wet Prep NONE SEEN NONE SEEN   Clue Cells Wet Prep HPF POC NONE SEEN NONE SEEN   WBC, Wet Prep HPF POC <10 <10   Sperm NONE SEEN   Pregnancy, urine POC     Status: Abnormal   Collection Time: 09/29/24 11:32 AM  Result Value Ref Range   Preg Test, Ur POSITIVE (A) NEGATIVE  Urinalysis, Routine w reflex microscopic -Urine, Clean Catch     Status: Abnormal   Collection Time: 09/29/24 11:42 AM  Result Value Ref Range   Color, Urine STRAW (A) YELLOW   APPearance CLEAR CLEAR   Specific Gravity, Urine 1.008 1.005 - 1.030   pH 6.0 5.0 - 8.0   Glucose, UA NEGATIVE NEGATIVE mg/dL   Hgb urine dipstick LARGE (A) NEGATIVE   Bilirubin Urine NEGATIVE NEGATIVE   Ketones, ur NEGATIVE NEGATIVE mg/dL   Protein, ur NEGATIVE NEGATIVE mg/dL   Nitrite NEGATIVE NEGATIVE   Leukocytes,Ua NEGATIVE NEGATIVE   RBC / HPF 0-5 0 - 5 RBC/hpf   WBC, UA 0-5 0 - 5 WBC/hpf   Bacteria, UA RARE (A) NONE SEEN   Squamous Epithelial / HPF 0-5 0 - 5 /HPF   Mucus PRESENT   CBC     Status: Abnormal   Collection Time: 09/29/24 11:48 AM  Result Value Ref Range   WBC 7.1 4.0 - 10.5 K/uL   RBC 4.23 3.87 - 5.11 MIL/uL   Hemoglobin 8.3 (L) 12.0 - 15.0 g/dL   HCT 71.1 (L) 63.9 - 53.9 %   MCV 68.1 (L) 80.0 - 100.0 fL   MCH 19.6 (L) 26.0 - 34.0 pg   MCHC 28.8 (L) 30.0 - 36.0 g/dL   RDW 80.0 (H) 88.4 - 84.4 %   Platelets 438 (H) 150 - 400 K/uL   nRBC 0.0 0.0 - 0.2 %  ABO/Rh     Status: None   Collection Time: 09/29/24 11:48 AM  Result Value Ref Range   ABO/RH(D) O POS    No rh immune globuloin      NOT A RH IMMUNE GLOBULIN CANDIDATE, PT RH POSITIVE Performed at [facility], [address] [place], [place] [address]   hCG, quantitative, pregnancy     Status: Abnormal   Collection Time: 09/29/24 11:48 AM  Result Value Ref Range  hCG, Beta Chain, Quant, S 81 (H) <5 mIU/mL     Narrative & Impression  EXAM: ULTRASOUND FIRST TRIMESTER   TECHNIQUE: Transabdominal and Transvaginal first trimester  obstetric pelvic duplex ultrasound was performed with real-time imaging, color flow Doppler imaging, and spectral analysis.   COMPARISON: None available.   CLINICAL HISTORY: Vaginal bleeding in pregnancy, first trimester.   FINDINGS:   UTERUS: No focal myometrial mass.   GESTATIONAL SAC(S): There is no intrauterine gestational sac, yolk sac, or embryo. No subchorionic hemorrhage.   YOLK SAC: No yolk sac.   EMBRYO(<11WK) /FETUS(>=11WK): No embryo.   CROWN RUMP LENGTH: Not measured   RATE OF CARDIAC ACTIVITY: Not measured.   RIGHT OVARY: Unremarkable. Normal arterial and venous flow.   LEFT OVARY: Unremarkable. Normal arterial and venous flow.   FREE FLUID: No free fluid.   MEASUREMENTS   ESTIMATED GESTATIONAL AGE BY CURRENT ULTRASOUND: Not applicable as no intrauterine gestational sac, yolk sac, or embryo is identified.   ESTIMATED GESTATIONAL AGE BY LMP/PRIOR ULTRASOUND: Not provided.   ESTIMATED DUE DATE: Not applicable.   IMPRESSION: 1. Pregnancy of unknown location in the setting of first-trimester vaginal bleeding, with no intrauterine gestational sac identified. 2. Differential diagnosis includes very early intrauterine pregnancy not yet visible, occult ectopic pregnancy, or completed spontaneous abortion. 3. Recommend serial quantitative beta-hCG and repeat transvaginal ultrasound in 7 to 10 days, sooner with worsening pain or bleeding.   Electronically signed by: Waddell Calk MD 09/29/2024 02:02 PM EST RP Workstation: [id number]      I have reviewed the patient chart and performed the physical exam . I have ordered & interpreted the lab results and reviewed and interpreted the OB ultrasound images independently and agree with the radiologist findings.    ASSESSMENT Medical screening exam complete  Elevated serum hCG Serum hCG 81 Repeat in 48 hours  Pregnancy of unknown anatomic location Will trend hCG levels Follow-up ultrasound as  clinically appropriate or in 7 to 10 days for viability  Vaginal bleeding Precautions regarding heavy vaginal bleeding and when to return to the MAU or seek care urgently  Miscarriage, threatened, early pregnancy - Patient seeking eTOP - Undesired pregnancy   Iron deficiency anemia, unspecified iron deficiency anemia type Prescription sent for Accufer ->to patient's pharmacy Asymptomatic for s/s of anemia at this time but in the setting of VB would encourage iron supplementation and an Iron panel at [facility] or with PCP   Iron rich foods encouraged.  Patient also encouraged to follow-up with primary care physician regarding anemia   PLAN  Return in 48 hours for f/u HCG  Ectopic precautions discussed with patient and she verbalized understanding  Discharge from MAU in stable condition  See AVS for full description of educational information and instructions provided to the patient at time of discharge  Warning signs for worsening condition that would warrant emergency follow-up discussed  Patient may return to MAU as needed   Littie Olam LABOR, NP 09/29/2024 2:45 PM

## 2024-09-29 NOTE — Discharge Instructions (Addendum)
 Return on Sunday 10/01/24 for HCG labs STAT  Please start Iron supplementation

## 2024-09-29 NOTE — MAU Note (Signed)
 Valerie Fleming is a 34 y.o. at Unknown here in MAU reporting: +HPT on 09/19/24 and [facility]. States she is not planning on keeping this pregnancy. Vaginal bleeding started last night and [facility] needed to have the pregnancy evaluated before they could proceed with termination.   LMP: unsure  Onset of complaint: last night  Pain score: 4 Vitals:   09/29/24 1135  BP: (!) 120/57  Pulse: (!) 57  Resp: 16  Temp: 98 F (36.7 C)  SpO2: 100%     FHT:n/a  Lab orders placed from triage:  cuba workup

## 2024-10-02 LAB — GC/CHLAMYDIA PROBE AMP (~~LOC~~) NOT AT ARMC
Chlamydia: NEGATIVE
Comment: NEGATIVE
Comment: NORMAL
Neisseria Gonorrhea: NEGATIVE
